# Patient Record
Sex: Male | Born: 1975 | Race: Black or African American | Hispanic: No | Marital: Single | Smoking: Current every day smoker
Health system: Southern US, Community
[De-identification: ages and names within clinical notes are randomized; demographics above are authoritative.]

---

## 2009-03-10 ENCOUNTER — Emergency Department: Payer: Self-pay | Admitting: Emergency Medicine

## 2009-07-19 ENCOUNTER — Emergency Department: Payer: Self-pay | Admitting: Emergency Medicine

## 2010-11-13 ENCOUNTER — Emergency Department: Payer: Self-pay | Admitting: Unknown Physician Specialty

## 2013-01-07 ENCOUNTER — Emergency Department: Payer: Self-pay | Admitting: Emergency Medicine

## 2014-04-26 ENCOUNTER — Emergency Department (HOSPITAL_COMMUNITY)
Admission: EM | Admit: 2014-04-26 | Discharge: 2014-04-26 | Disposition: A | Discharge status: Home / Self Care | Payer: Self-pay | Attending: Emergency Medicine | Admitting: Emergency Medicine

## 2014-04-26 DIAGNOSIS — Z23 Encounter for immunization: Secondary | ICD-10-CM | POA: Insufficient documentation

## 2014-04-26 DIAGNOSIS — S51812A Laceration without foreign body of left forearm, initial encounter: Secondary | ICD-10-CM

## 2014-04-26 DIAGNOSIS — W268XXA Contact with other sharp object(s), not elsewhere classified, initial encounter: Secondary | ICD-10-CM | POA: Insufficient documentation

## 2014-04-26 DIAGNOSIS — Y939 Activity, unspecified: Secondary | ICD-10-CM | POA: Insufficient documentation

## 2014-04-26 DIAGNOSIS — Y929 Unspecified place or not applicable: Secondary | ICD-10-CM | POA: Insufficient documentation

## 2014-04-26 DIAGNOSIS — S51809A Unspecified open wound of unspecified forearm, initial encounter: Secondary | ICD-10-CM | POA: Insufficient documentation

## 2014-04-26 MED ORDER — HYDROCODONE-ACETAMINOPHEN 5-325 MG PO TABS: 1.0000 | ORAL_TABLET | Freq: Four times a day (QID) | ORAL | Status: AC | PRN

## 2014-04-26 MED ORDER — TETANUS-DIPHTH-ACELL PERTUSSIS 5-2.5-18.5 LF-MCG/0.5 IM SUSP
0.5000 mL | Freq: Once | INTRAMUSCULAR | Status: AC
  Administered 2014-04-26: 0.5 mL via INTRAMUSCULAR
  Filled 2014-04-26: qty 0.5

## 2014-04-26 MED ORDER — LIDOCAINE-EPINEPHRINE-TETRACAINE (LET) SOLUTION
3.0000 mL | Freq: Once | NASAL | Status: DC
  Filled 2014-04-26: qty 3

## 2014-04-26 MED ORDER — LIDOCAINE-EPINEPHRINE 2 %-1:100000 IJ SOLN
INTRAMUSCULAR | Status: AC
  Filled 2014-04-26: qty 1

## 2014-04-26 MED ORDER — LORAZEPAM 2 MG/ML IJ SOLN
1.0000 mg | Freq: Once | INTRAMUSCULAR | Status: DC
  Filled 2014-04-26: qty 1

## 2014-04-26 NOTE — ED Provider Notes (Signed)
CSN: [id number]     Arrival date & time 04/26/14  1205 History   First MD Initiated Contact with Patient 04/26/14 1234     Chief Complaint  Patient presents with  . Extremity Laceration     (Consider location/radiation/quality/duration/timing/severity/associated sxs/prior Treatment) HPI Nicholas Fitzpatrick is a 38 y.o. male who presents to ED with complaint of a laceration. Patient states he was working on something in his carotids when he turned and cut his left forearm with a metal sharp razor. Patient reports large laceration to that area. Tetanus unknown. Wound was wrapped with Tylenol and he came to the emergency department. He denies numbness or weakness distal to the injury. Reports he is able to move his hand and wrist. No other complaints.  No past medical history on file. No past surgical history on file. No family history on file. History  Substance Use Topics  . Smoking status: Not on file  . Smokeless tobacco: Not on file  . Alcohol Use: Not on file    Review of Systems  Constitutional: Negative for fever and chills.  Respiratory: Negative for cough, chest tightness and shortness of breath.   Cardiovascular: Negative for chest pain, palpitations and leg swelling.  Genitourinary: Negative for dysuria, urgency, frequency and hematuria.  Musculoskeletal: Negative for arthralgias, myalgias, neck pain and neck stiffness.  Skin: Positive for wound. Negative for rash.  Allergic/Immunologic: Negative for immunocompromised state.  Neurological: Negative for dizziness, weakness, light-headedness, numbness and headaches.      Allergies  Review of patient's allergies indicates no known allergies.  Home Medications   Prior to Admission medications   Medication Sig Start Date End Date Taking? Authorizing Provider  HYDROcodone-acetaminophen (NORCO) 5-325 MG per tablet Take 1 tablet by mouth every 6 (six) hours as needed. 04/26/14   Nayden Czajka A Judas Mohammad, PA-C   BP 129/74  Pulse 65   Temp(Src) 98 F (36.7 C)  Resp 16  SpO2 99% Physical Exam  Nursing note and vitals reviewed. Constitutional: He is oriented to person, place, and time. He appears well-developed and well-nourished. No distress.  Eyes: Conjunctivae are normal.  Neck: Neck supple.  Cardiovascular: Normal rate, regular rhythm and normal heart sounds.   Pulmonary/Chest: Effort normal and breath sounds normal. No respiratory distress. He has no wheezes. He has no rales.  Musculoskeletal: Normal range of motion.  Range of motion of all fingers, wrist, elbow on the left upper extremity. Normal strength with wrist flexion and extension against resistance. Pt is able to make a fist, thumb up, oppose thumb to each finger tip, cross 2nd and 3rd fingers. Sensation intact to all dermatomes of the hand and forearm.   Neurological: He is alert and oriented to person, place, and time.  Skin: Skin is warm and dry.  11 cm laceration to the posterior left forearm, gaping. Laceration does not go through fascia.    ED Course  Procedures (including critical care time) Labs Review Labs Reviewed - No data to display  Imaging Review No results found.  LACERATION REPAIR Performed by: Lottie Mussel Authorized by: Jaynie Crumble A Consent: Verbal consent obtained. Risks and benefits: risks, benefits and alternatives were discussed Consent given by: patient Patient identity confirmed: provided demographic data Prepped and Draped in normal sterile fashion Wound explored  Laceration Location: left forearm  Laceration Length: 11cm  No Foreign Bodies seen or palpated  Anesthesia: local infiltration  Local anesthetic: lidocaine 2% w epinephrine  Anesthetic total: 10 ml  Irrigation method: syringe Amount of cleaning:  standard  Skin closure: sub cutaneous -vicril rapid 4.0, prolene - 4.0  Number of sutures: 4 sub cutaneous, 7 superficial  Technique: simple interrupted  For Sub cut, horizontal mattress  for superficial  Patient tolerance: Patient tolerated the procedure well with no immediate complications.   MDM   Final diagnoses:  Laceration of left forearm, initial encounter   Patient with large laceration to left forearm. Its explored, I do not see any evidence of muscle, tendon, nerve injuries. He has good sensation distally to the laceration, full-strength of the wrist and hand. Laceration is closed with horizontal mattress sutures. Tetanus updated. Home with Norco for pain. Followup for suture removal in 10 days.  Filed Vitals:   04/26/14 1222  BP: 129/74  Pulse: 65  Temp: 98 F (36.7 C)  Resp: 16  SpO2: 99%      Emmeline Winebarger A Hanah Moultry, PA-C 04/26/14 1446

## 2014-04-26 NOTE — ED Notes (Signed)
Pt lacerated left forearm 11 cm long x 2 cm wide, minimal sanguinous blood leaking from wound. Wound cleaned with normal saline. Lacerated with metal razor. Last tetanus unknown.

## 2014-04-26 NOTE — Discharge Instructions (Signed)
Keep wound clean. Bacitracin topically twice a day. Ibuprofen or tylenol for pain. Norco for severe pain. Follow up with your doctor, urgent care, or here for recheck and suture removal in 10 days.    Laceration Care, Adult A laceration is a cut or lesion that goes through all layers of the skin and into the tissue just beneath the skin. TREATMENT  Some lacerations may not require closure. Some lacerations may not be able to be closed due to an increased risk of infection. It is important to see your caregiver as soon as possible after an injury to minimize the risk of infection and maximize the opportunity for successful closure. If closure is appropriate, pain medicines may be given, if needed. The wound will be cleaned to help prevent infection. Your caregiver will use stitches (sutures), staples, wound glue (adhesive), or skin adhesive strips to repair the laceration. These tools bring the skin edges together to allow for faster healing and a better cosmetic outcome. However, all wounds will heal with a scar. Once the wound has healed, scarring can be minimized by covering the wound with sunscreen during the day for 1 full year. HOME CARE INSTRUCTIONS  For sutures or staples:  Keep the wound clean and dry.  If you were given a bandage (dressing), you should change it at least once a day. Also, change the dressing if it becomes wet or dirty, or as directed by your caregiver.  Wash the wound with soap and water 2 times a day. Rinse the wound off with water to remove all soap. Pat the wound dry with a clean towel.  After cleaning, apply a thin layer of the antibiotic ointment as recommended by your caregiver. This will help prevent infection and keep the dressing from sticking.  You may shower as usual after the first 24 hours. Do not soak the wound in water until the sutures are removed.  Only take over-the-counter or prescription medicines for pain, discomfort, or fever as directed by your  caregiver.  Get your sutures or staples removed as directed by your caregiver. For skin adhesive strips:  Keep the wound clean and dry.  Do not get the skin adhesive strips wet. You may bathe carefully, using caution to keep the wound dry.  If the wound gets wet, pat it dry with a clean towel.  Skin adhesive strips will fall off on their own. You may trim the strips as the wound heals. Do not remove skin adhesive strips that are still stuck to the wound. They will fall off in time. For wound adhesive:  You may briefly wet your wound in the shower or bath. Do not soak or scrub the wound. Do not swim. Avoid periods of heavy perspiration until the skin adhesive has fallen off on its own. After showering or bathing, gently pat the wound dry with a clean towel.  Do not apply liquid medicine, cream medicine, or ointment medicine to your wound while the skin adhesive is in place. This may loosen the film before your wound is healed.  If a dressing is placed over the wound, be careful not to apply tape directly over the skin adhesive. This may cause the adhesive to be pulled off before the wound is healed.  Avoid prolonged exposure to sunlight or tanning lamps while the skin adhesive is in place. Exposure to ultraviolet light in the first year will darken the scar.  The skin adhesive will usually remain in place for 5 to 10 days, then  naturally fall off the skin. Do not pick at the adhesive film. You may need a tetanus shot if:  You cannot remember when you had your last tetanus shot.  You have never had a tetanus shot. If you get a tetanus shot, your arm may swell, get red, and feel warm to the touch. This is common and not a problem. If you need a tetanus shot and you choose not to have one, there is a rare chance of getting tetanus. Sickness from tetanus can be serious. SEEK MEDICAL CARE IF:   You have redness, swelling, or increasing pain in the wound.  You see a red line that goes away  from the wound.  You have yellowish-white fluid (pus) coming from the wound.  You have a fever.  You notice a bad smell coming from the wound or dressing.  Your wound breaks open before or after sutures have been removed.  You notice something coming out of the wound such as wood or glass.  Your wound is on your hand or foot and you cannot move a finger or toe. SEEK IMMEDIATE MEDICAL CARE IF:   Your pain is not controlled with prescribed medicine.  You have severe swelling around the wound causing pain and numbness or a change in color in your arm, hand, leg, or foot.  Your wound splits open and starts bleeding.  You have worsening numbness, weakness, or loss of function of any joint around or beyond the wound.  You develop painful lumps near the wound or on the skin anywhere on your body. MAKE SURE YOU:   Understand these instructions.  Will watch your condition.  Will get help right away if you are not doing well or get worse. Document Released: 08/10/2005 Document Revised: 11/02/2011 Document Reviewed: 02/03/2011 [facility] Patient Information 2015 [place], [place]. This information is not intended to replace advice given to you by your health care provider. Make sure you discuss any questions you have with your health care provider.

## 2014-04-26 NOTE — Progress Notes (Signed)
  CARE MANAGEMENT ED NOTE 04/26/2014  Patient:  Nicholas Fitzpatrick,Nicholas Fitzpatrick   Account Number:  [id number]  Date Initiated:  04/26/2014  Documentation initiated by:  Edd Arbour  Subjective/Objective Assessment:   38 yr old male self pay [place] c/o laceration of left arm     Subjective/Objective Assessment Detail:     Action/Plan:   Provided with self pay resources for [employer] for self pay pcps   Action/Plan Detail:   Anticipated DC Date:  04/26/2014     Status Recommendation to Physician:   Result of Recommendation:    Other ED Services  Consult Working Plan    DC Planning Services  Other  PCP issues  Outpatient Services - Pt will follow up    Choice offered to / List presented to:            Status of service:  Completed, signed off  ED Comments:   ED Comments Detail:

## 2014-04-27 NOTE — ED Provider Notes (Signed)
Medical screening examination/treatment/procedure(s) were performed by non-physician practitioner and as supervising physician I was immediately available for consultation/collaboration.   EKG Interpretation None       Kane Kusek, MD 04/27/14 0731 

## 2014-05-10 ENCOUNTER — Emergency Department: Payer: Self-pay | Admitting: Student

## 2014-07-07 IMAGING — CT CT OF THE RIGHT SHOULDER WITHOUT CONTRAST
1 of 2 series · 9 of 14 positions shown, 12 images · non-contrast
Comparison: none

REASON FOR EXAM: assess glenoid fracture with hemarthrosis
COMMENTS:   LMP: (Male)

[Series 4: mpr · axial · 0.50mm/px · z∈[-180,-43]mm · 9 of 216 slices shown, 12 images]
[im 22/216  soft-tissue]
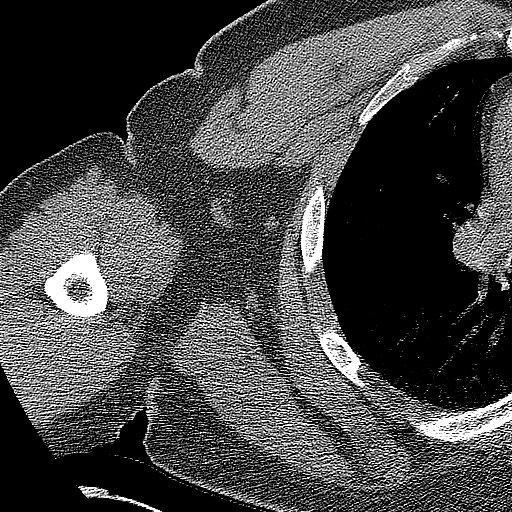
[im 22/216  bone]
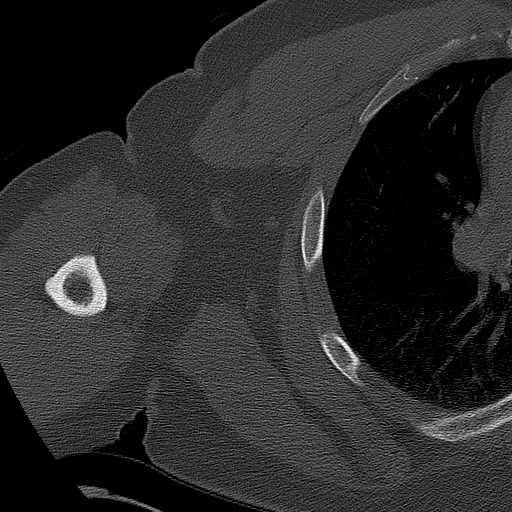
[im 44/216  bone]
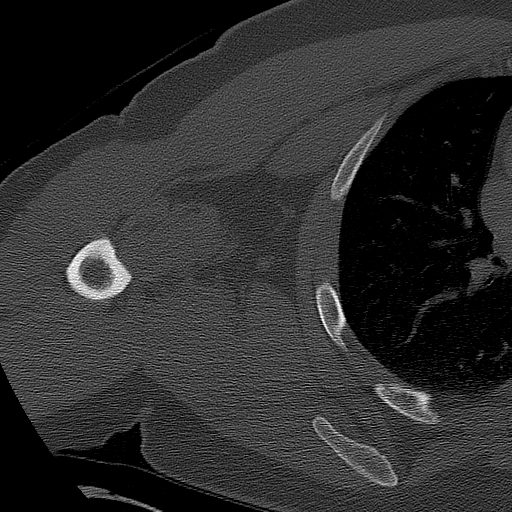
[im 65/216  bone]
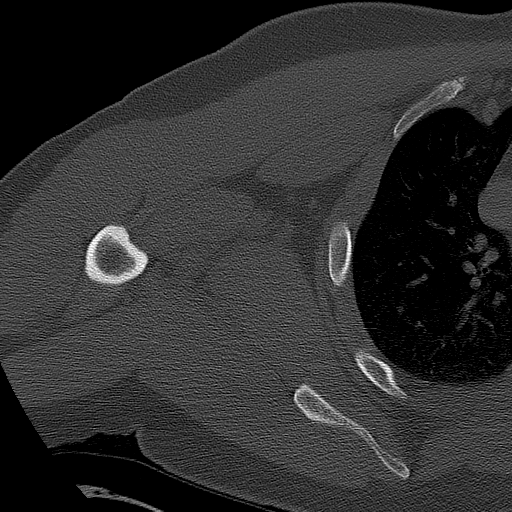
[im 87/216  bone]
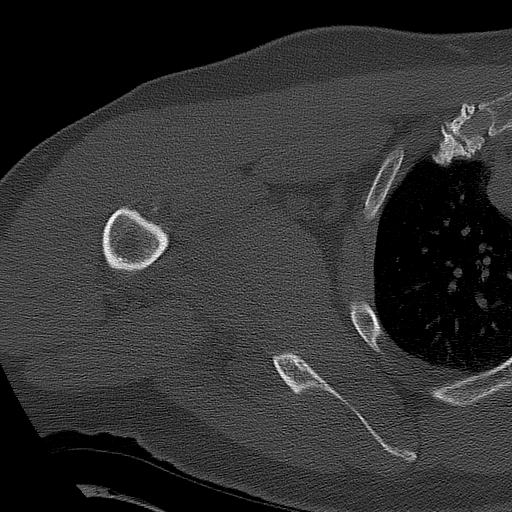
[im 108/216  soft-tissue]
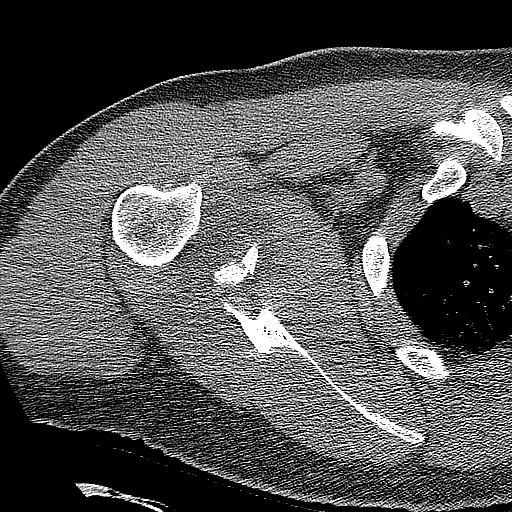
[im 108/216  bone]
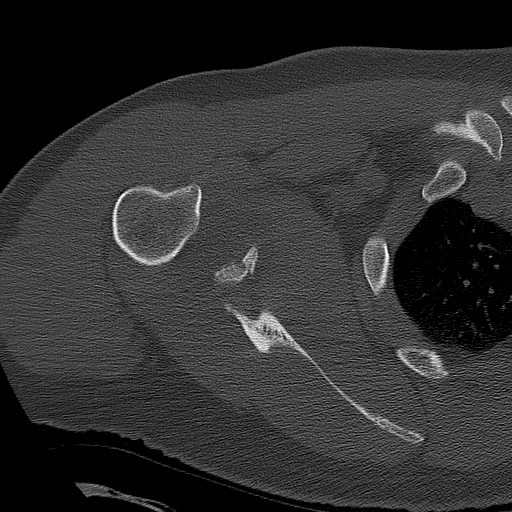
[im 130/216  bone]
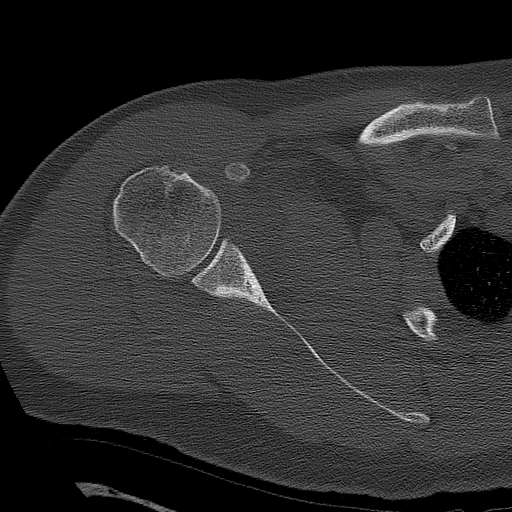
[im 151/216  bone]
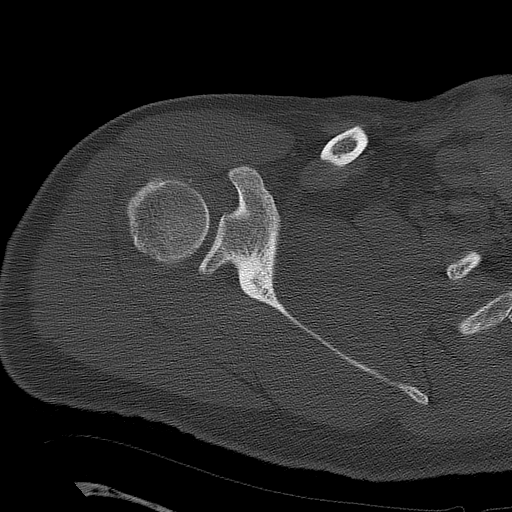
[im 173/216  bone]
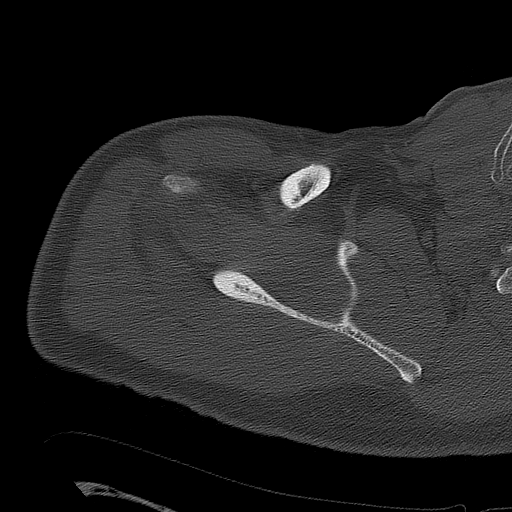
[im 194/216  soft-tissue]
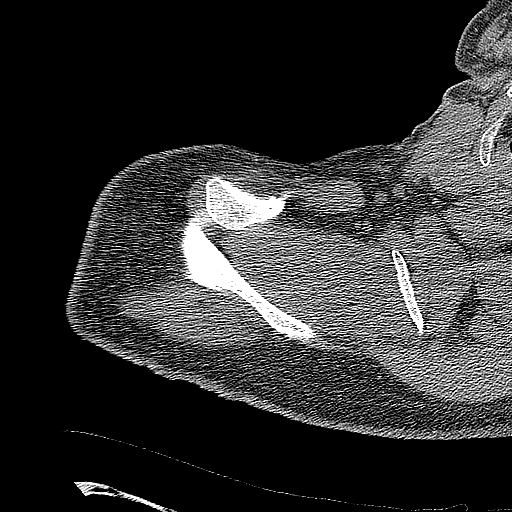
[im 194/216  bone]
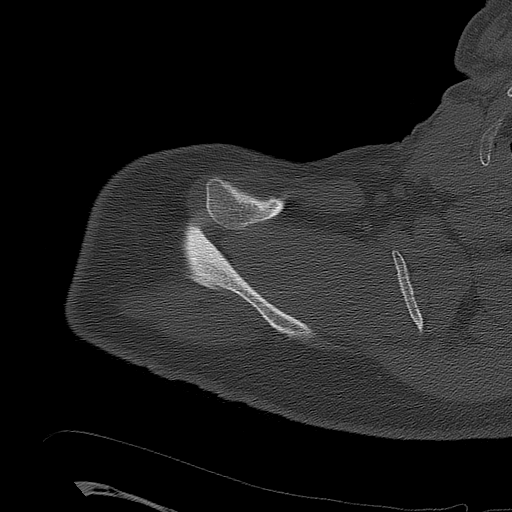

[9 of 14 positions shown; findings below may reference images not displayed]

PROCEDURE:     CT  - CT SHOULDER RIGHT WO  - January 07, 2013  [DATE]

RESULT:     Axial CT scanning was performed through the right shoulder using
a bone algorithm. Coronal and sagittal and three-dimensional images were
reconstructed.

The patient has sustained an acute displaced fracture involving the
anterior/inferior aspect of the glenoid. The humeral head remains normally
related to the bony glenoid. There is no evidence of an acute fracture of
the the humeral head or neck. There are bony densities within the joint
space consistent with free fragments. The AC joint is intact. The body of
the scapula is intact.
IMPRESSION: 1. The patient has sustained a comminuted displaced fracture of the
anterior/inferior aspect of the bony glenoid. There are [REDACTED] fragments within the joint space.
2. The humeral head is intact and remains normally related to the glenoid.

## 2014-07-07 IMAGING — CR DG SHOULDER 1V*R*
1 series · 1 of 1 positions shown · non-contrast
Comparison: none

REASON FOR EXAM: add axillary view to determine posterior dislocation
COMMENTS:

PROCEDURE:     DXR - DXR SHOULDER RIGHT ONE VIEW  - January 07, 2013  [DATE]
RESULT:     A single axillary view of the right shoulder reveals the
glenohumeral joint to be normally positioned. I do not see evidence of
dislocation.

[x shoulder axillary right]
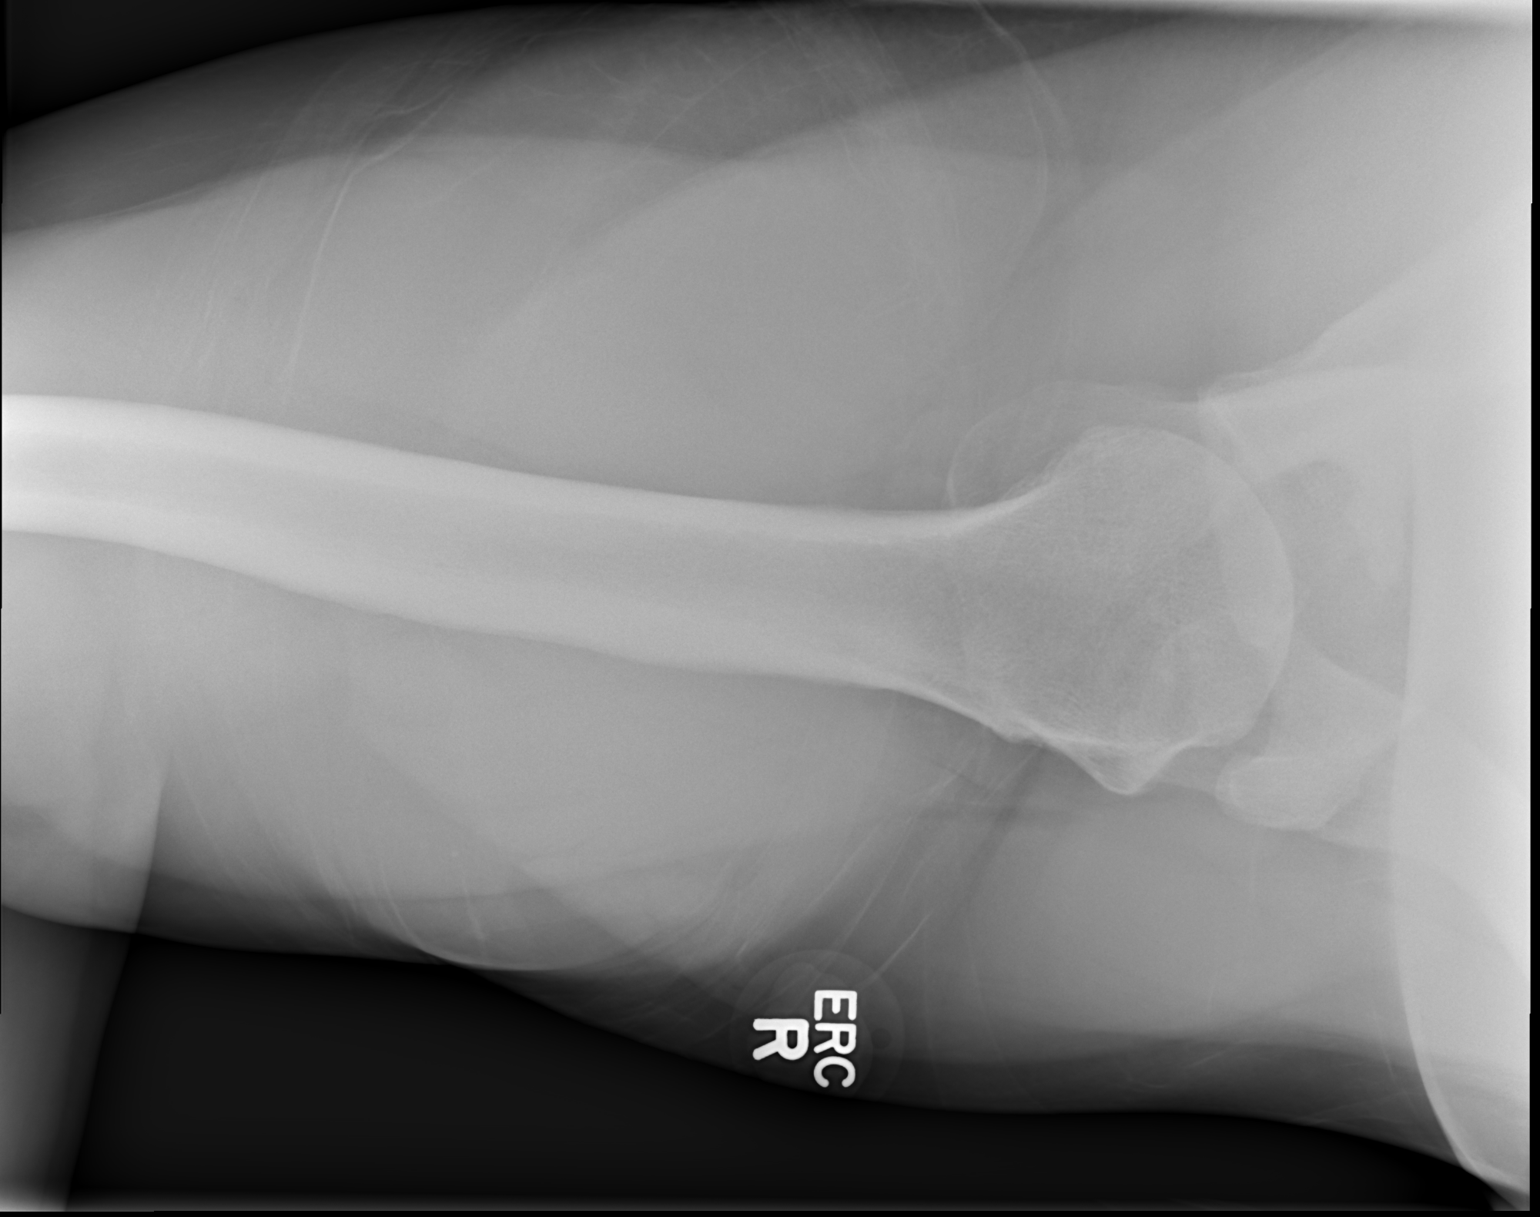

[1 of 1 positions shown; findings below may reference images not displayed]

IMPRESSION: No dislocation is demonstrated.

[REDACTED]

## 2019-05-10 ENCOUNTER — Other Ambulatory Visit: Payer: Self-pay

## 2019-05-10 ENCOUNTER — Emergency Department: Payer: Self-pay

## 2019-05-10 ENCOUNTER — Emergency Department
Admission: EM | Admit: 2019-05-10 | Discharge: 2019-05-10 | Disposition: A | Discharge status: Home / Self Care | Payer: Self-pay | Attending: Emergency Medicine | Admitting: Emergency Medicine

## 2019-05-10 DIAGNOSIS — F17210 Nicotine dependence, cigarettes, uncomplicated: Secondary | ICD-10-CM | POA: Insufficient documentation

## 2019-05-10 DIAGNOSIS — R110 Nausea: Secondary | ICD-10-CM | POA: Insufficient documentation

## 2019-05-10 DIAGNOSIS — R1011 Right upper quadrant pain: Secondary | ICD-10-CM | POA: Insufficient documentation

## 2019-05-10 DIAGNOSIS — Z79899 Other long term (current) drug therapy: Secondary | ICD-10-CM | POA: Insufficient documentation

## 2019-05-10 LAB — COMPREHENSIVE METABOLIC PANEL
ALT: 22 U/L (ref 0–44)
AST: 25 U/L (ref 15–41)
Albumin: 4.6 g/dL (ref 3.5–5.0)
Alkaline Phosphatase: 49 U/L (ref 38–126)
Anion gap: 14 (ref 5–15)
BUN: 12 mg/dL (ref 6–20)
CO2: 23 mmol/L (ref 22–32)
Calcium: 10.1 mg/dL (ref 8.9–10.3)
Chloride: 101 mmol/L (ref 98–111)
Creatinine, Ser: 1.35 mg/dL — ABNORMAL HIGH (ref 0.61–1.24)
GFR calc Af Amer: >60 mL/min (ref >60)
GFR calc non Af Amer: >60 mL/min (ref >60)
Glucose, Bld: 132 mg/dL — ABNORMAL HIGH (ref 70–99)
Potassium: 3.7 mmol/L (ref 3.5–5.1)
Sodium: 138 mmol/L (ref 135–145)
Total Bilirubin: 1 mg/dL (ref 0.3–1.2)
Total Protein: 8.2 g/dL — ABNORMAL HIGH (ref 6.5–8.1)

## 2019-05-10 LAB — URINALYSIS, COMPLETE (UACMP) WITH MICROSCOPIC
Bacteria, UA: NONE SEEN
Bilirubin Urine: NEGATIVE
Glucose, UA: NEGATIVE mg/dL
Hgb urine dipstick: NEGATIVE
Ketones, ur: 80 mg/dL — AB
Nitrite: NEGATIVE
Protein, ur: 100 mg/dL — AB
Specific Gravity, Urine: 1.03 (ref 1.005–1.030)
pH: 7 (ref 5.0–8.0)

## 2019-05-10 LAB — CBC
HCT: 46 % (ref 39.0–52.0)
Hemoglobin: 15.6 g/dL (ref 13.0–17.0)
MCH: 28.7 pg (ref 26.0–34.0)
MCHC: 33.9 g/dL (ref 30.0–36.0)
MCV: 84.7 fL (ref 80.0–100.0)
Platelets: 238 10*3/uL (ref 150–400)
RBC: 5.43 MIL/uL (ref 4.22–5.81)
RDW: 13.1 % (ref 11.5–15.5)
WBC: 7.9 10*3/uL (ref 4.0–10.5)
nRBC: 0 % (ref 0.0–0.2)

## 2019-05-10 LAB — LIPASE, BLOOD: Lipase: 22 U/L (ref 11–51)

## 2019-05-10 LAB — TROPONIN I (HIGH SENSITIVITY)
Troponin I (High Sensitivity): 3 ng/L (ref <18)
Troponin I (High Sensitivity): 4 ng/L (ref <18)

## 2019-05-10 MED ORDER — FENTANYL CITRATE (PF) 100 MCG/2ML IJ SOLN
50.0000 ug | INTRAMUSCULAR | Status: AC | PRN
  Administered 2019-05-10 (×2): 50 ug via INTRAVENOUS
  Filled 2019-05-10 (×2): qty 2

## 2019-05-10 MED ORDER — SODIUM CHLORIDE 0.9 % IV BOLUS
1000.0000 mL | Freq: Once | INTRAVENOUS | Status: AC
  Administered 2019-05-10: 1000 mL via INTRAVENOUS

## 2019-05-10 MED ORDER — ONDANSETRON HCL 4 MG PO TABS: 4.0000 mg | ORAL_TABLET | Freq: Every day | ORAL | 0 refills | Status: AC | PRN

## 2019-05-10 MED ORDER — HALOPERIDOL LACTATE 5 MG/ML IJ SOLN
2.0000 mg | Freq: Once | INTRAMUSCULAR | Status: AC
  Administered 2019-05-10: 22:00:00 2 mg via INTRAVENOUS
  Filled 2019-05-10: qty 1

## 2019-05-10 MED ORDER — HYDROMORPHONE HCL 1 MG/ML IJ SOLN
1.0000 mg | Freq: Once | INTRAMUSCULAR | Status: AC
  Administered 2019-05-10: 1 mg via INTRAVENOUS
  Filled 2019-05-10: qty 1

## 2019-05-10 MED ORDER — ONDANSETRON HCL 4 MG/2ML IJ SOLN
4.0000 mg | Freq: Once | INTRAMUSCULAR | Status: AC | PRN
  Administered 2019-05-10: 4 mg via INTRAVENOUS
  Filled 2019-05-10: qty 2

## 2019-05-10 MED ORDER — ONDANSETRON HCL 4 MG/2ML IJ SOLN
4.0000 mg | Freq: Once | INTRAMUSCULAR | Status: AC
  Administered 2019-05-10: 4 mg via INTRAVENOUS
  Filled 2019-05-10: qty 2

## 2019-05-10 NOTE — ED Provider Notes (Signed)
[facility] Emergency Department Provider Note  ____________________________________________   First MD Initiated Contact with Patient 05/10/19 1820     (approximate)  I have reviewed the triage vital signs and the nursing notes.   HISTORY  Chief Complaint Abdominal Pain    HPI Nicholas Fitzpatrick is a 43 y.o. male otherwise healthy except for reported history of issues with his gallbladder who now presents with right upper quadrant pain.  Per patient he was seen at outside hospital over a year ago was told that his gallbladder need to be removed.  He never followed up with it.  Patient endorses that today he developed right upper quadrant pain that started this morning has been constant, nothing makes it better, nothing makes it worse.  Has had some associated nausea with it.  He denies any fevers.     History reviewed. No pertinent past medical history.  There are no active problems to display for this patient.   History reviewed. No pertinent surgical history.  Prior to Admission medications   Medication Sig Start Date End Date Taking? Authorizing Provider  HYDROcodone-acetaminophen (NORCO) 5-325 MG per tablet Take 1 tablet by mouth every 6 (six) hours as needed. 04/26/14   Jeannett Senior, PA-C    Allergies Patient has no known allergies.  No family history on file.  Social History Social History   Tobacco Use   Smoking status: Current Every Day Smoker  Substance Use Topics   Alcohol use: Never    Frequency: Never   Drug use: Not on file      Review of Systems Constitutional: No fever/chills Eyes: No visual changes. ENT: No sore throat. Cardiovascular: Denies chest pain. Respiratory: Denies shortness of breath. Gastrointestinal: Positive abdominal pain and nausea Genitourinary: Negative for dysuria. Musculoskeletal: Negative for back pain. Skin: Negative for rash. Neurological: Negative for headaches, focal weakness or  numbness. All other ROS negative ____________________________________________   PHYSICAL EXAM:  VITAL SIGNS: ED Triage Vitals  Enc Vitals Group     BP 05/10/19 1625 (!) 151/100     Pulse Rate 05/10/19 1625 67     Resp 05/10/19 1625 18     Temp 05/10/19 1625 98.1 F (36.7 C)     Temp Source 05/10/19 1625 Oral     SpO2 05/10/19 1625 100 %     Weight 05/10/19 1626 280 lb (127 kg)     Height 05/10/19 1626 6\' 3"  (1.905 m)     Head Circumference --      Peak Flow --      Pain Score 05/10/19 1626 10     Pain Loc --      Pain Edu? --      Excl. in [place]? --     Constitutional: Alert and oriented. Well appearing and in no acute distress. Eyes: Conjunctivae are normal. EOMI. Head: Atraumatic. Nose: No congestion/rhinnorhea. Mouth/Throat: Mucous membranes are moist.   Neck: No stridor. Trachea Midline. FROM Cardiovascular: Normal rate, regular rhythm. Grossly normal heart sounds.  Good peripheral circulation. Respiratory: Normal respiratory effort.  No retractions. Lungs CTAB. Gastrointestinal: Soft with right upper quadrant tenderness no distention. No abdominal bruits.  Musculoskeletal: No lower extremity tenderness nor edema.  No joint effusions. Neurologic:  Normal speech and language. No gross focal neurologic deficits are appreciated.  Skin:  Skin is warm, dry and intact. No rash noted. Psychiatric: Mood and affect are normal. Speech and behavior are normal. GU: Deferred   ____________________________________________   LABS (all labs ordered are  listed, but only abnormal results are displayed)  Labs Reviewed  COMPREHENSIVE METABOLIC PANEL - Abnormal; Notable for the following components:      Result Value   Glucose, Bld 132 (*)    Creatinine, Ser 1.35 (*)    Total Protein 8.2 (*)    All other components within normal limits  URINALYSIS, COMPLETE (UACMP) WITH MICROSCOPIC - Abnormal; Notable for the following components:   Color, Urine AMBER (*)    APPearance HAZY (*)      Ketones, ur 80 (*)    Protein, ur 100 (*)    Leukocytes,Ua TRACE (*)    All other components within normal limits  LIPASE, BLOOD  CBC  TROPONIN I (HIGH SENSITIVITY)  TROPONIN I (HIGH SENSITIVITY)   ____________________________________________   ED ECG REPORT I, Concha SeMary E Joanthony Hamza, the attending physician, personally viewed and interpreted this ECG.  EKG is normal sinus rate of 68, flipped T waves in 2 3 aVF and V4 through V6, no prior EKGs to compare to, normal intervals ____________________________________________  RADIOLOGY  Official radiology report(s): Ct Renal Stone Study  Result Date: 05/10/2019 CLINICAL DATA:  43 year old male with right upper quadrant pain, flank pain. EXAM: CT ABDOMEN AND PELVIS WITHOUT CONTRAST TECHNIQUE: Multidetector CT imaging of the abdomen and pelvis was performed following the standard protocol without IV contrast. COMPARISON:  None. FINDINGS: Lower chest: Negative. Hepatobiliary: Negative noncontrast liver. Contracted gallbladder with no pericholecystic inflammation. Pancreas: Negative noncontrast pancreas. Spleen: Negative. Adrenals/Urinary Tract: Normal adrenal glands. No nephrolithiasis, hydronephrosis or perinephric stranding. Decompressed and negative course of the left ureter. Decompressed and negative course of the right ureter. Decompressed urinary bladder. Several pelvic phleboliths but no urinary calculus identified. Stomach/Bowel: Decompressed large bowel throughout the abdomen, a especially from the transverse colon through the rectum. No definite large bowel inflammation. Normal appendix on series 2, image 60. Decompressed and negative terminal ileum. Decompressed small bowel loops throughout the abdomen. Some fluid and gas in the stomach which appears negative. No free air, free fluid. Vascular/Lymphatic: Mild aorta and more moderate iliac artery calcified atherosclerosis. Vascular patency is not evaluated in the absence of IV contrast. No  lymphadenopathy. Reproductive: Negative. Other: No pelvic free fluid. Musculoskeletal: Advanced disc and endplate degeneration at the lumbosacral junction. Vacuum disc. 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 No acute osseous abnormality identified. IMPRESSION: 1. No urinary calculus or obstructive uropathy. 2. No acute or inflammatory process identified in the non-contrast abdomen or pelvis. Normal appendix. Electronically Signed   By: Odessa FlemingH  Hall M.D.   On: 05/10/2019 20:51   [place] Abdomen Limited Ruq  Result Date: 05/10/2019 CLINICAL DATA:  43 year old male right upper quadrant abdominal pain for 1 day. EXAM: ULTRASOUND ABDOMEN LIMITED RIGHT UPPER QUADRANT COMPARISON:  None. FINDINGS: Gallbladder: No gallstones or wall thickening visualized. No sonographic Murphy sign noted by sonographer. Common bile duct: Diameter: 4 millimeters, normal. Liver: No focal lesion identified. Within normal limits in parenchymal echogenicity. Portal vein is patent on color Doppler imaging with normal direction of blood flow towards the liver. Other: Right renal cortical echogenicity appears at the upper limits of normal to increased on image 98. Otherwise negative visible right kidney. IMPRESSION: 1. Negative gallbladder, CBD, and liver. 2. Suggestion of increased renal cortical echogenicity such as due to chronic medical renal disease. Electronically Signed   By: Odessa FlemingH  Hall M.D.   On: 05/10/2019 19:51    ____________________________________________   PROCEDURES  Procedure(s) performed (including Critical Care):  Procedures   ____________________________________________   INITIAL  IMPRESSION / ASSESSMENT AND PLAN / ED COURSE  Nicholas OpitzBobby Fitzpatrick was evaluated in Emergency Department on 05/10/2019 for the symptoms described in the history of present illness. He was evaluated in the context of the global COVID-19 pandemic, which necessitated consideration that the patient might be at risk for infection with  the SARS-CoV-2 virus that causes COVID-19. Institutional protocols and algorithms that pertain to the evaluation of patients at risk for COVID-19 are in a state of rapid change based on information released by regulatory bodies including the CDC and federal and state organizations. These policies and algorithms were followed during the patient's care in the ED.   Patient presented with right upper quadrant pain and nausea.  Will get a ultrasound to evaluate for cholecystitis, biliary colic.  Will get LFTs and lipase to evaluate pancreatitis.  Patient has no symptoms suggest UTI.  No lower abdominal pain to suggest appendicitis.  No chest pain but does have some inverted T's waves without prior EKG therefore will get cardiac markers to confirm this is not related to his chest  LFTs are normal.  Lipase is normal.  White count shows no evidence of infection.  8:10 PM reevaluated patient.  Pain is better.  Ultrasound was negative for gallstones.  Will add on CT scan to evaluate for renal stone.    CT scan was also negative for kidney stone.  I discussed with patient I am unclear why they told him he needed to have his gallbladder out.  Is a potential that it is just not releasing the bile like it is supposed to and that he may need to follow-up with with surgery for additional testing or if he can get his records from the outside hospital that would be helpful as well.  Patient urine does show some signs of dehydration.  Offered patient IV fluids versus p.o. hydration.  Patient said he prefer to try to take p.o.   Repeat troponins x2 were negative therefore ACS is less likely.  Patient did have some dry heaving but no actual vomiting.  Patient does endorse using THC daily.  Will trial some Haldol and see if this improves patient's symptoms.  Patient able to tolerate drinking fluid after the Haldol.  Reevaluated patient and he feels comfortable with being discharged home at this time.  He will follow-up  with the surgery team outpatient.  Instructed to cut down on his THC use.   I discussed the provisional nature of ED diagnosis, the treatment so far, the ongoing plan of care, follow up appointments and return precautions with the patient and any family or support people present. They expressed understanding and agreed with the plan, discharged home.     ____________________________________________   FINAL CLINICAL IMPRESSION(S) / ED DIAGNOSES   Final diagnoses:  RUQ pain  Nausea      MEDICATIONS GIVEN DURING THIS VISIT:  Medications  ondansetron (ZOFRAN) injection 4 mg (4 mg Intravenous Given 05/10/19 1632)  fentaNYL (SUBLIMAZE) injection 50 mcg (50 mcg Intravenous Given 05/10/19 1813)  HYDROmorphone (DILAUDID) injection 1 mg (1 mg Intravenous Given 05/10/19 1829)  ondansetron (ZOFRAN) injection 4 mg (4 mg Intravenous Given 05/10/19 1829)  haloperidol lactate (HALDOL) injection 2 mg (2 mg Intravenous Given 05/10/19 2206)  sodium chloride 0.9 % bolus 1,000 mL (1,000 mLs Intravenous New Bag/Given 05/10/19 2206)     ED Discharge Orders    None       Note:  This document was prepared using Dragon voice recognition software and may include  unintentional dictation errors.   Concha Se, MD 05/10/19 [phone]

## 2019-05-10 NOTE — ED Notes (Signed)
Cup of water given to pt to PO challenge him. Will continue to monitor.

## 2019-05-10 NOTE — ED Triage Notes (Signed)
RUQ pain that started this AM. NVD. Pt shivering, moaning and restless in wheelchair. Reports recent gallbladder problems and was supposed to followup with surgeon.

## 2019-05-10 NOTE — ED Notes (Signed)
Pt states that he does not have to urinate at this moment. Urinal placed at bedside.

## 2019-05-10 NOTE — Discharge Instructions (Addendum)
Your work-up here was reassuring with negative CT scan negative ultrasound however he can follow-up with surgery for more additional testing of your gallbladder.  Cut down on your marijuana use because that can also cause some nausea and vomiting.  We have prescribed some Zofran to help.  Try to drink as much fluid as you can.  Return to the ER if you develop worsening vomiting fevers or any other concerns

## 2019-06-07 ENCOUNTER — Ambulatory Visit: Payer: Self-pay | Admitting: Surgery

## 2019-06-19 ENCOUNTER — Ambulatory Visit: Payer: Self-pay | Admitting: Surgery

## 2019-09-20 ENCOUNTER — Encounter: Payer: Self-pay | Admitting: *Deleted

## 2020-01-26 ENCOUNTER — Ambulatory Visit: Payer: Self-pay

## 2020-11-06 IMAGING — US US ABDOMEN LIMITED
1 series · 14 of 25 positions shown · non-contrast
Comparison: None.

CLINICAL DATA: 43-year-old male right upper quadrant abdominal pain
for 1 day.

EXAM:
ULTRASOUND ABDOMEN LIMITED RIGHT UPPER QUADRANT

[Series 1: us abdomen limited · 14 of 103 slices shown]
[im 1/103]
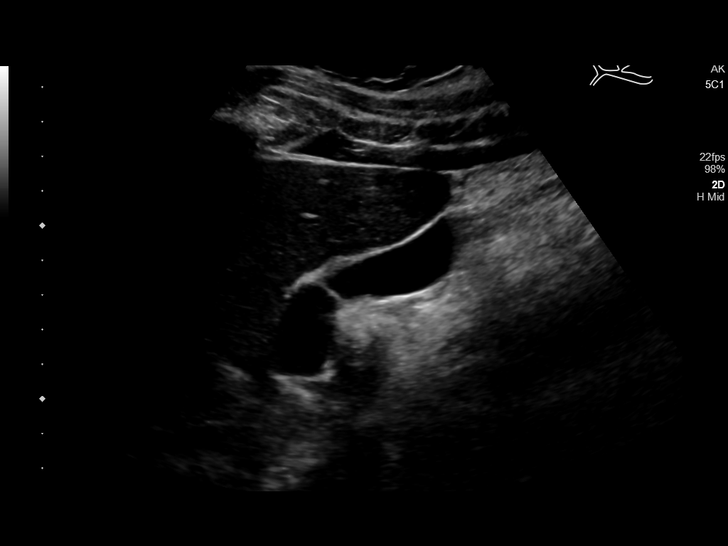
[im 9/103]
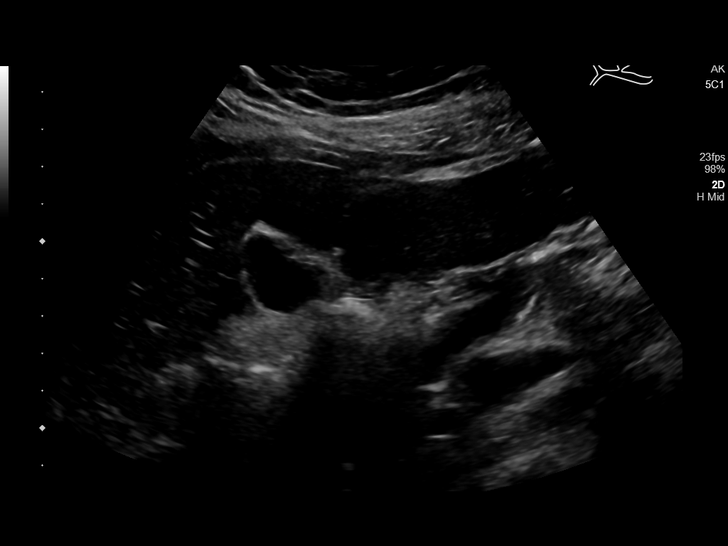
[im 18/103]
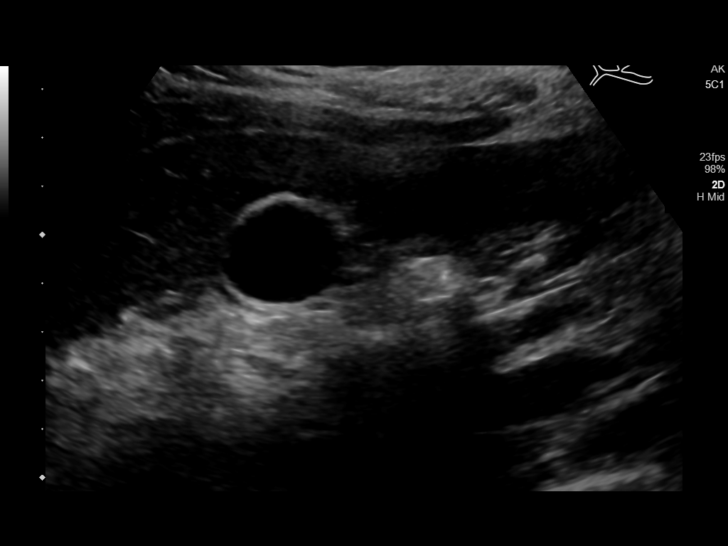
[im 26/103]
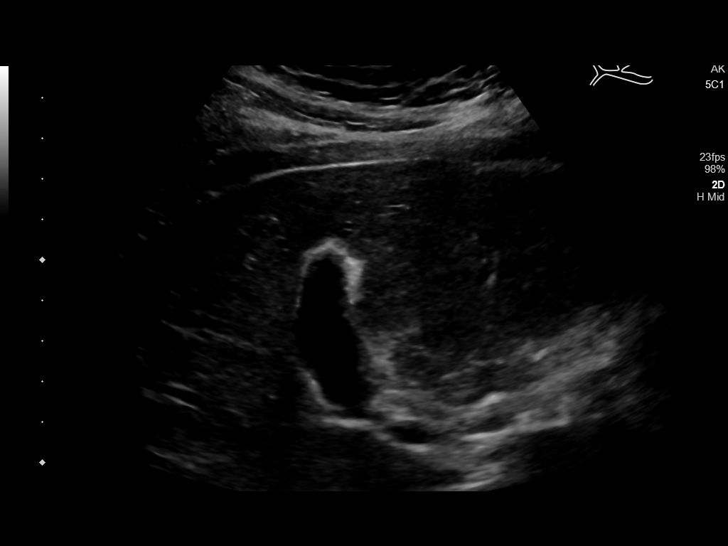
[im 35/103]
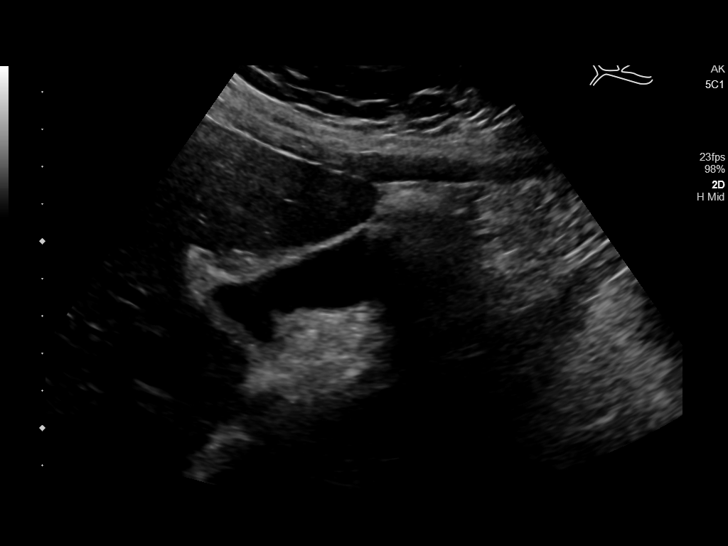
[im 39/103]
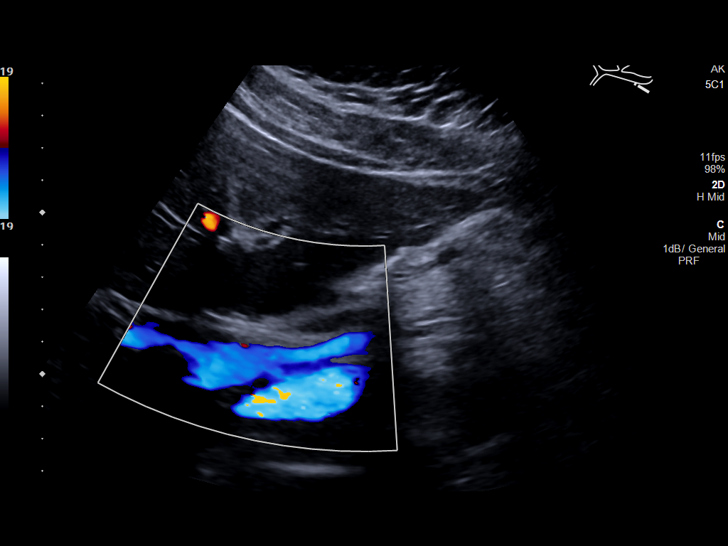
[im 47/103]
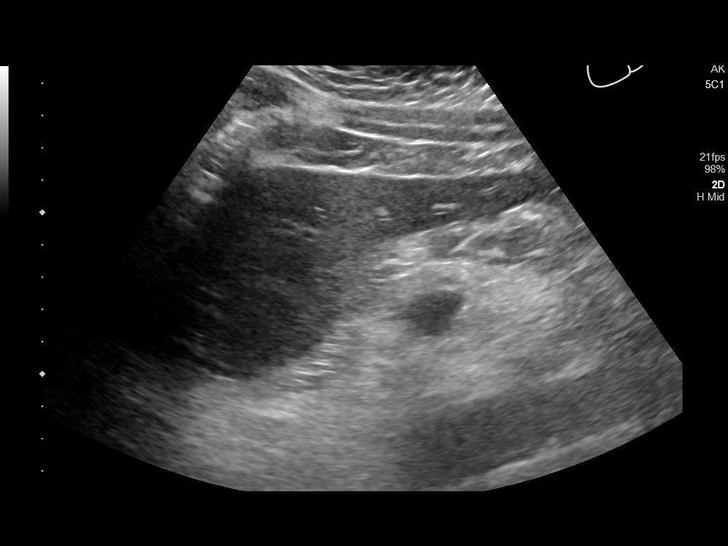
[im 56/103]
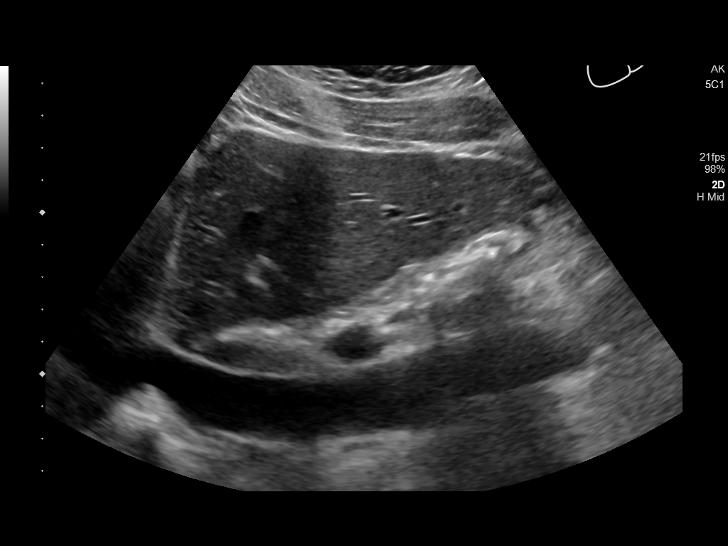
[im 64/103]
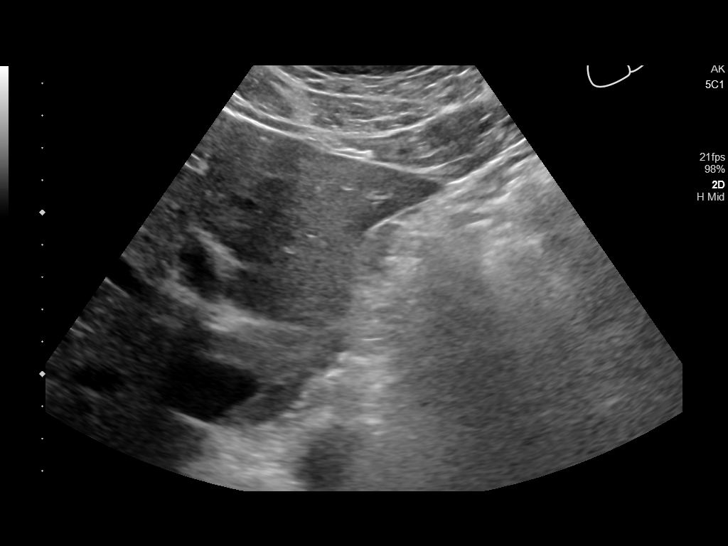
[im 69/103]
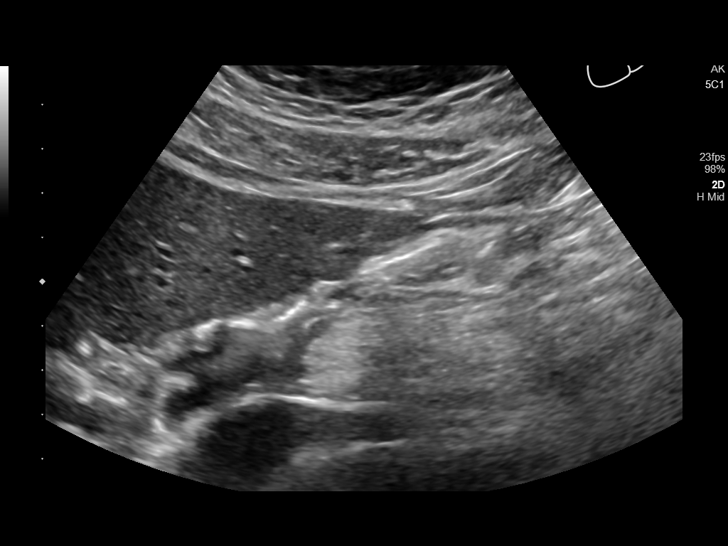
[im 77/103]
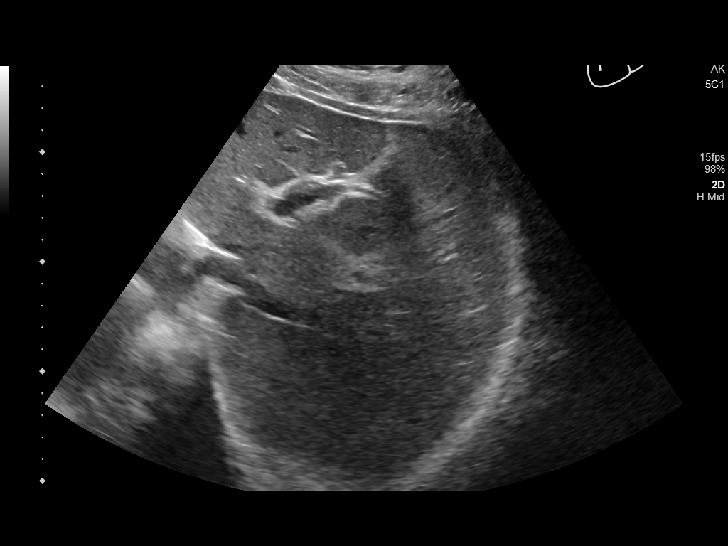
[im 86/103]
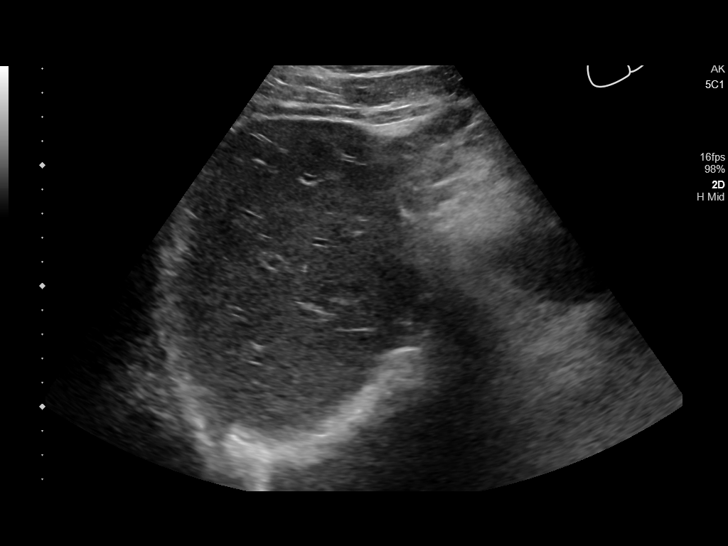
[im 94/103]
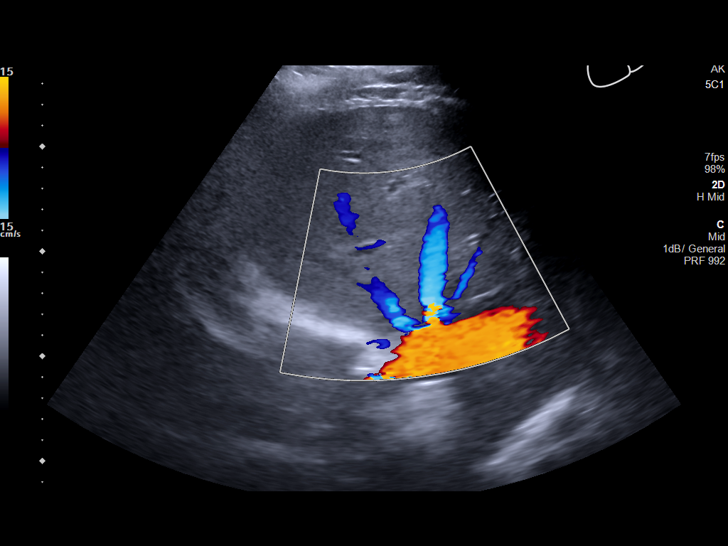
[im 103/103]
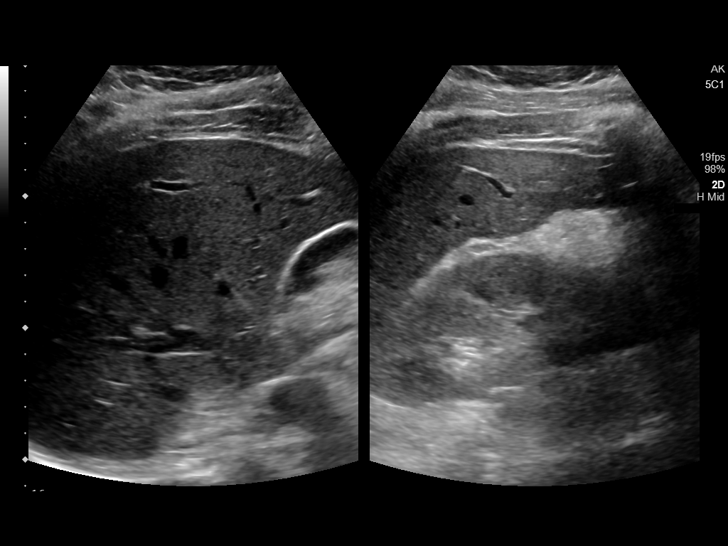

[14 of 25 positions shown; findings below may reference images not displayed]

FINDINGS: Gallbladder:

No gallstones or wall thickening visualized. No sonographic Murphy
sign noted by sonographer.

Common bile duct:

Diameter: 4 millimeters, normal.

Liver:

No focal lesion identified. Within normal limits in parenchymal
echogenicity. Portal vein is patent on color Doppler imaging with
normal direction of blood flow towards the liver.

Other: Right renal cortical echogenicity appears at the upper limits
of normal to increased on image 98. Otherwise negative visible right
kidney.
IMPRESSION: 1. Negative gallbladder, CBD, and liver.
2. Suggestion of increased renal cortical echogenicity such as due
to chronic medical renal disease.
